# Patient Record
Sex: Female | Born: 1970 | Hispanic: Yes | Marital: Married | Smoking: Never smoker
Health system: Southern US, Community
[De-identification: ages and names within clinical notes are randomized; demographics above are authoritative.]

## PROBLEM LIST (undated history)

## (undated) DIAGNOSIS — J45909 Unspecified asthma, uncomplicated: Secondary | ICD-10-CM

## (undated) DIAGNOSIS — R7303 Prediabetes: Secondary | ICD-10-CM

---

## 2016-01-20 ENCOUNTER — Encounter: Payer: Self-pay | Admitting: Emergency Medicine

## 2016-01-20 ENCOUNTER — Emergency Department: Payer: Self-pay

## 2016-01-20 ENCOUNTER — Emergency Department
Admission: EM | Admit: 2016-01-20 | Discharge: 2016-01-20 | Disposition: A | Discharge status: Home / Self Care | Payer: Self-pay | Attending: Emergency Medicine | Admitting: Emergency Medicine

## 2016-01-20 DIAGNOSIS — R1030 Lower abdominal pain, unspecified: Secondary | ICD-10-CM | POA: Insufficient documentation

## 2016-01-20 DIAGNOSIS — M25551 Pain in right hip: Secondary | ICD-10-CM | POA: Insufficient documentation

## 2016-01-20 DIAGNOSIS — Z791 Long term (current) use of non-steroidal anti-inflammatories (NSAID): Secondary | ICD-10-CM | POA: Insufficient documentation

## 2016-01-20 DIAGNOSIS — M25552 Pain in left hip: Secondary | ICD-10-CM | POA: Insufficient documentation

## 2016-01-20 DIAGNOSIS — N939 Abnormal uterine and vaginal bleeding, unspecified: Secondary | ICD-10-CM | POA: Insufficient documentation

## 2016-01-20 LAB — COMPREHENSIVE METABOLIC PANEL
ALBUMIN: 3.9 g/dL (ref 3.5–5.0)
ALT: 44 U/L (ref 14–54)
ANION GAP: 6 (ref 5–15)
AST: 31 U/L (ref 15–41)
Alkaline Phosphatase: 72 U/L (ref 38–126)
BILIRUBIN TOTAL: 0.6 mg/dL (ref 0.3–1.2)
BUN: 12 mg/dL (ref 6–20)
CO2: 27 mmol/L (ref 22–32)
Calcium: 9 mg/dL (ref 8.9–10.3)
Chloride: 105 mmol/L (ref 101–111)
Creatinine, Ser: 1.01 mg/dL — ABNORMAL HIGH (ref 0.44–1.00)
GFR calc Af Amer: >60 mL/min (ref >60)
GFR calc non Af Amer: >60 mL/min (ref >60)
GLUCOSE: 154 mg/dL — AB (ref 65–99)
POTASSIUM: 3.3 mmol/L — AB (ref 3.5–5.1)
SODIUM: 138 mmol/L (ref 135–145)
TOTAL PROTEIN: 7.5 g/dL (ref 6.5–8.1)

## 2016-01-20 LAB — CHLAMYDIA/NGC RT PCR (ARMC ONLY)
CHLAMYDIA TR: NOT DETECTED
N gonorrhoeae: NOT DETECTED

## 2016-01-20 LAB — WET PREP, GENITAL
CLUE CELLS WET PREP: NONE SEEN
SPERM: NONE SEEN
TRICH WET PREP: NONE SEEN
WBC WET PREP: NONE SEEN
Yeast Wet Prep HPF POC: NONE SEEN

## 2016-01-20 LAB — CBC
HEMATOCRIT: 39 % (ref 35.0–47.0)
HEMOGLOBIN: 13.6 g/dL (ref 12.0–16.0)
MCH: 30.4 pg (ref 26.0–34.0)
MCHC: 34.9 g/dL (ref 32.0–36.0)
MCV: 87.1 fL (ref 80.0–100.0)
Platelets: 288 10*3/uL (ref 150–440)
RBC: 4.47 MIL/uL (ref 3.80–5.20)
RDW: 14.3 % (ref 11.5–14.5)
WBC: 8.3 10*3/uL (ref 3.6–11.0)

## 2016-01-20 LAB — HCG, QUANTITATIVE, PREGNANCY: hCG, Beta Chain, Quant, S: 2 m[IU]/mL (ref <5)

## 2016-01-20 MED ORDER — MEDROXYPROGESTERONE ACETATE 10 MG PO TABS
10.0000 mg | ORAL_TABLET | Freq: Every day | ORAL | Status: DC
  Administered 2016-01-20: 10 mg via ORAL
  Filled 2016-01-20: qty 1

## 2016-01-20 MED ORDER — MEDROXYPROGESTERONE ACETATE 10 MG PO TABS: 10.0000 mg | ORAL_TABLET | Freq: Every day | ORAL | 0 refills | Status: AC

## 2016-01-20 MED ORDER — IBUPROFEN 800 MG PO TABS: 800.0000 mg | ORAL_TABLET | Freq: Three times a day (TID) | ORAL | 0 refills | Status: AC | PRN

## 2016-01-20 NOTE — ED Provider Notes (Signed)
[facility] Emergency Department Provider Note   ____________________________________________   First MD Initiated Contact with Patient 01/20/16 [phone]     (approximate)  I have reviewed the triage vital signs and the nursing notes.   HISTORY  Chief Complaint Abdominal Pain    HPI Kaitlyn Watts is a 45 y.o. female who comes into the hospital today with abdominal pain and vaginal bleeding. The patient reports thatthis pain started yesterday. The patient has a history of a tubal ligation and typically has her period every month but has not had a period in 4 months. The patient is feeling pressure in her vagina. She reports that her last bowel movement was this morning. It was normal but she did have to push to get it out. The patient has had some nausea and vomiting with no dizziness but did feel like she was given a pass out. The patient reports that she has pain in her back and her hips. The patient reaches her pain a 10 out of 10 in intensity. The patient reports that initially she took some ibuprofen for the pain and it was better but then it the patient feels as though she has some vaginal pressure as well. She is here for evaluation.   History reviewed. No pertinent past medical history.  There are no active problems to display for this patient.   History reviewed. No pertinent surgical history.  Prior to Admission medications   Not on File    Allergies Review of patient's allergies indicates no known allergies.  No family history on file.  Social History Social History  Substance Use Topics  . Smoking status: Never Smoker  . Smokeless tobacco: Never Used  . Alcohol use No    Review of Systems Constitutional: No fever/chills Eyes: No visual changes. ENT: No sore throat. Cardiovascular: Denies chest pain. Respiratory: Denies shortness of breath. Gastrointestinal: No abdominal pain.  No nausea, no vomiting.  No diarrhea.  No  constipation. Genitourinary: Negative for dysuria. Musculoskeletal: Negative for back pain. Skin: Negative for rash. Neurological: Negative for headaches, focal weakness or numbness.  10-point ROS otherwise negative.  ____________________________________________   PHYSICAL EXAM:  VITAL SIGNS: ED Triage Vitals [01/20/16 0615]  Enc Vitals Group     BP                                   01/20/16 0650 109/66     Pulse                               01/20/16 0650 83     Resp                                01/20/16 0650 16     Temp      Temp src      SpO2                                01/20/16 0650 99%     Weight      Height      Head Circumference      Peak Flow      Pain Score  01/20/16 0650 10     Pain Loc      Pain Edu?      Excl. in [place]?     Constitutional: Alert and oriented. Well appearing and in Moderate distress. Eyes: Conjunctivae are normal. PERRL. EOMI. Head: Atraumatic. Nose: No congestion/rhinnorhea. Mouth/Throat: Mucous membranes are moist.  Oropharynx non-erythematous. Cardiovascular: Normal rate, regular rhythm. Grossly normal heart sounds.  Good peripheral circulation. Respiratory: Normal respiratory effort.  No retractions. Lungs CTAB. Gastrointestinal: Soft and nontender. No distention. Positive bowel sounds Genitourinary: Normal external genitalia, mild blood in the vaginal canal, narrow cervical opening with minimal cervical motion tenderness and an enlarged uterus. Musculoskeletal: No lower extremity tenderness nor edema.   Neurologic:  Normal speech and language.  Skin:  Skin is warm, dry and intact. No rash noted. Psychiatric: Mood and affect are normal.   ____________________________________________   LABS (all labs ordered are listed, but only abnormal results are displayed)  Labs Reviewed  COMPREHENSIVE METABOLIC PANEL - Abnormal; Notable for the following:       Result Value   Potassium 3.3 (*)    Glucose, Bld 154 (*)     Creatinine, Ser 1.01 (*)    All other components within normal limits  WET PREP, GENITAL  CBC  URINALYSIS COMPLETEWITH MICROSCOPIC ([facility] ONLY)  HCG, QUANTITATIVE, PREGNANCY  POC URINE PREG, ED   ____________________________________________  EKG  none ____________________________________________  RADIOLOGY  US pelvis ____________________________________________   PROCEDURES  Procedure(s) performed: None  Procedures  Critical Care performed: No  ____________________________________________   INITIAL IMPRESSION / ASSESSMENT AND PLAN / ED COURSE  Pertinent labs & imaging results that were available during my care of the patient were reviewed by me and considered in my medical decision making (see chart for details).  This is a 45 year old female who comes in for hospital today with lower abdominal pain and vaginal bleeding. The patient is having some pain she says since yesterday. The patient initially when I did a pelvic exam was not bleeding but then developed some profuse bleeding afterwards. I will attempt to get the patient's urinalysis and sent her for an ultrasound to determine what may be causing her bleeding. The patient's care will be signed out to Dr. Mayford KnifeWilliams who will follow-up the results of ultrasound and the rest of the patient's blood work.  Clinical Course     ____________________________________________   FINAL CLINICAL IMPRESSION(S) / ED DIAGNOSES  Final diagnoses:  Lower abdominal pain  Vaginal bleeding      NEW MEDICATIONS STARTED DURING THIS VISIT:  New Prescriptions   No medications on file     Note:  This document was prepared using Dragon voice recognition software and may include unintentional dictation errors.    Rebecka ApleyAllison P Webster, MD 01/20/16 [phone]

## 2016-01-20 NOTE — ED Triage Notes (Addendum)
Pt ambulatory to triage, appears uncomfortable, restless, holding lower back, crying; husb reports lower abd and back pain since yesterday accomp by vomiting; pt st no menstrual period for last 4 months, now bleeding with pain, like contractions

## 2016-01-20 NOTE — ED Notes (Signed)
Attempt twice to get in and out cath. Kaitlyn SparJulie Ed tech tried and myself only got blood no urine. Dr. Zenda AlpersWebster made aware.

## 2016-01-20 NOTE — ED Provider Notes (Signed)
Recent Results (from the past 2160 hour(s))  CBC     Status: None   Collection Time: 01/20/16  6:28 AM  Result Value Ref Range   WBC 8.3 3.6 - 11.0 K/uL   RBC 4.47 3.80 - 5.20 MIL/uL   Hemoglobin 13.6 12.0 - 16.0 g/dL   HCT 39.0 35.0 - 47.0 %   MCV 87.1 80.0 - 100.0 fL   MCH 30.4 26.0 - 34.0 pg   MCHC 34.9 32.0 - 36.0 g/dL   RDW 14.3 11.5 - 14.5 %   Platelets 288 150 - 440 K/uL  Comprehensive metabolic panel     Status: Abnormal   Collection Time: 01/20/16  6:28 AM  Result Value Ref Range   Sodium 138 135 - 145 mmol/L   Potassium 3.3 (L) 3.5 - 5.1 mmol/L   Chloride 105 101 - 111 mmol/L   CO2 27 22 - 32 mmol/L   Glucose, Bld 154 (H) 65 - 99 mg/dL   BUN 12 6 - 20 mg/dL   Creatinine, Ser 1.01 (H) 0.44 - 1.00 mg/dL   Calcium 9.0 8.9 - 10.3 mg/dL   Total Protein 7.5 6.5 - 8.1 g/dL   Albumin 3.9 3.5 - 5.0 g/dL   AST 31 15 - 41 U/L   ALT 44 14 - 54 U/L   Alkaline Phosphatase 72 38 - 126 U/L   Total Bilirubin 0.6 0.3 - 1.2 mg/dL   GFR calc non Af Amer >60 >60 mL/min   GFR calc Af Amer >60 >60 mL/min    Comment: (NOTE) The eGFR has been calculated using the CKD EPI equation. This calculation has not been validated in all clinical situations. eGFR's persistently <60 mL/min signify possible Chronic Kidney Disease.    Anion gap 6 5 - 15  hCG, quantitative, pregnancy     Status: None   Collection Time: 01/20/16  6:28 AM  Result Value Ref Range   hCG, Beta Chain, Quant, S 2 <5 mIU/mL    Comment:          GEST. AGE      CONC.  (mIU/mL)   <=1 WEEK        5 - 50     2 WEEKS       50 - 500     3 WEEKS       100 - 10,000     4 WEEKS     1,000 - 30,000     5 WEEKS     3,500 - 115,000   6-8 WEEKS     12,000 - 270,000    12 WEEKS     15,000 - 220,000        FEMALE AND NON-PREGNANT FEMALE:     LESS THAN 5 mIU/mL   Wet prep, genital     Status: None   Collection Time: 01/20/16  6:31 AM  Result Value Ref Range   Yeast Wet Prep HPF POC NONE SEEN NONE SEEN   Trich, Wet Prep NONE SEEN  NONE SEEN   Clue Cells Wet Prep HPF POC NONE SEEN NONE SEEN   WBC, Wet Prep HPF POC NONE SEEN NONE SEEN   Sperm NONE SEEN   Chlamydia/NGC rt PCR ([facility] only)     Status: None   Collection Time: 01/20/16  6:31 AM  Result Value Ref Range   Specimen source GC/Chlam ENDOCERVICAL    Chlamydia Tr NOT DETECTED NOT DETECTED   N gonorrhoeae NOT DETECTED NOT DETECTED    Comment: (NOTE)  100  This methodology has not been evaluated in pregnant women or in 200  patients with a history of hysterectomy. 300 400  This methodology will not be performed on patients less than 54  years of age.    IMPRESSION: Abnormally thickened endometrium in a premenopausal patient. If bleeding remains unresponsive to hormonal or medical therapy, focal lesion work-up with sonohysterogram should be considered. Endometrial biopsy should also be considered in pre-menopausal patients at high risk for endometrial carcinoma. (Ref: Radiological Reasoning: Algorithmic Workup of Abnormal Vaginal Bleeding with Endovaginal Sonography and Sonohysterography. AJR 2008; 010:U72-53)  Patient with ultrasound findings as dictated above. She will be started on hormonal therapy and referred to OB/GYN for close outpatient follow-up.     Earleen Newport, MD 01/20/16 [phone]

## 2016-01-20 NOTE — ED Notes (Signed)
Report given to Jane RN

## 2016-08-18 IMAGING — US US TRANSVAGINAL NON-OB
1 series · 13 of 25 positions shown · non-contrast
Comparison: None

CLINICAL DATA: Pelvic pain and bleeding beginning last night.

EXAM:
TRANSABDOMINAL AND TRANSVAGINAL ULTRASOUND OF PELVIS
TECHNIQUE: Both transabdominal and transvaginal ultrasound examinations of the
pelvis were performed. Transabdominal technique was performed for
global imaging of the pelvis including uterus, ovaries, adnexal
regions, and pelvic cul-de-sac. It was necessary to proceed with
endovaginal exam following the transabdominal exam to visualize the
right ovary.

[Series 1: us transvaginal non-ob · 0.27mm/px · 13 of 84 slices shown]
[im 1/84]
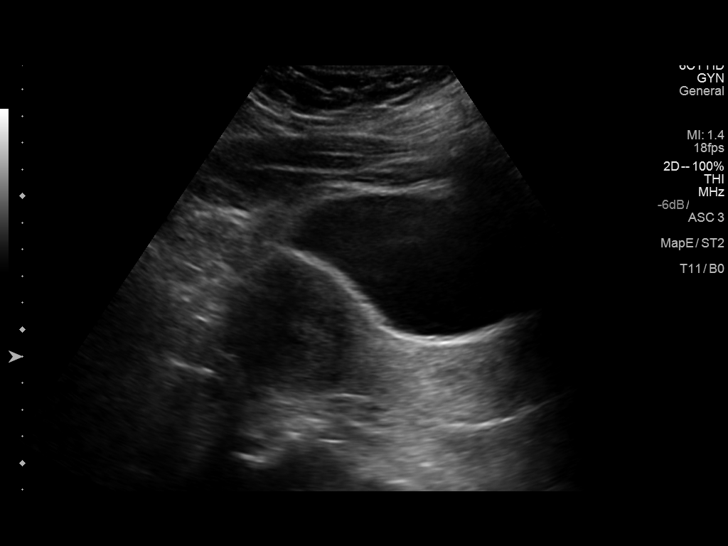
[im 7/84]
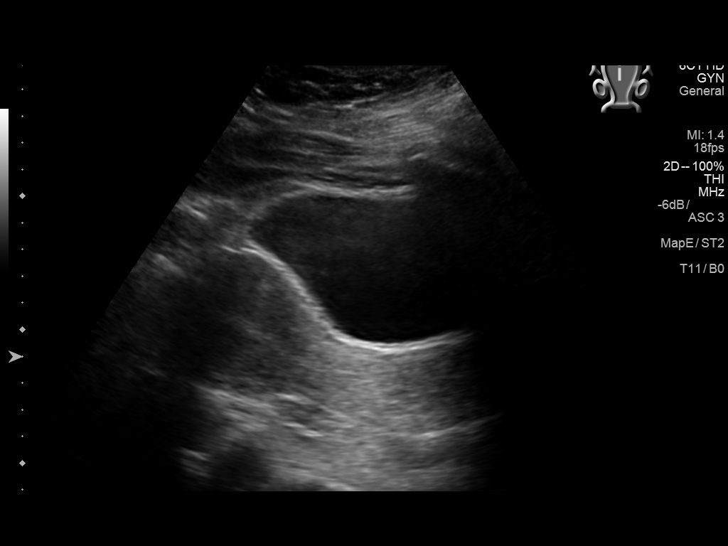
[im 14/84]
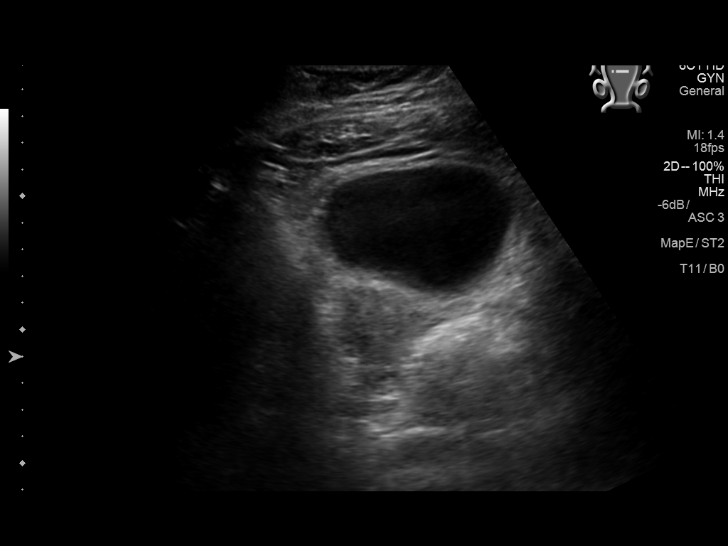
[im 21/84]
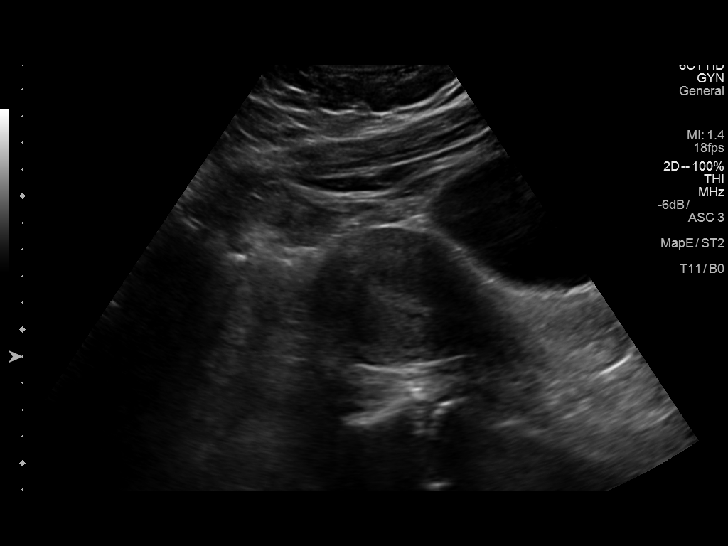
[im 28/84]
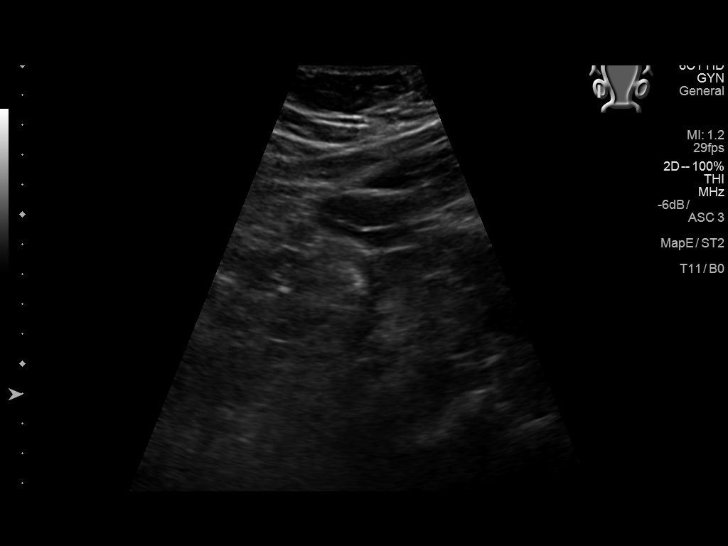
[im 35/84]
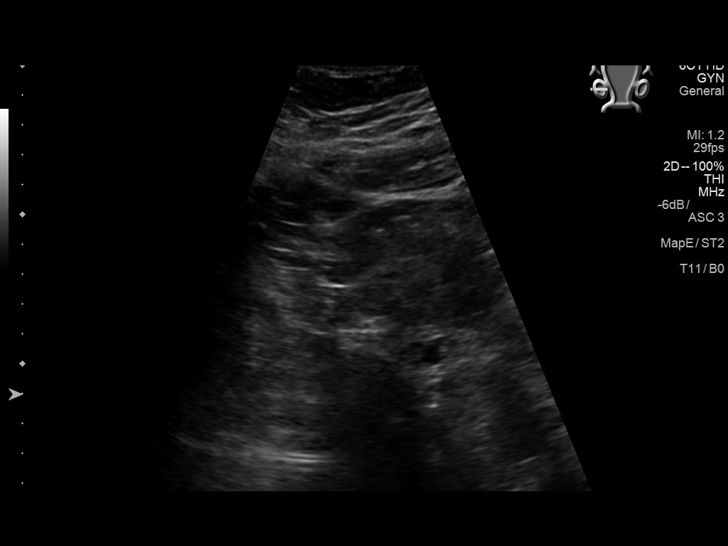
[im 42/84]
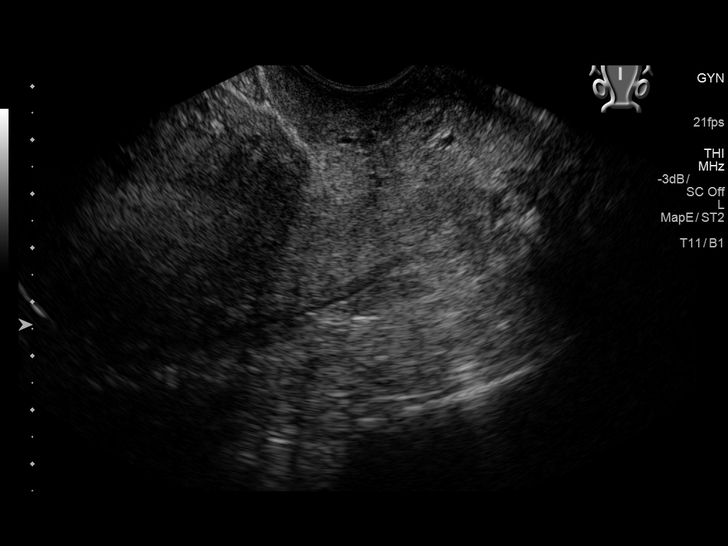
[im 49/84]
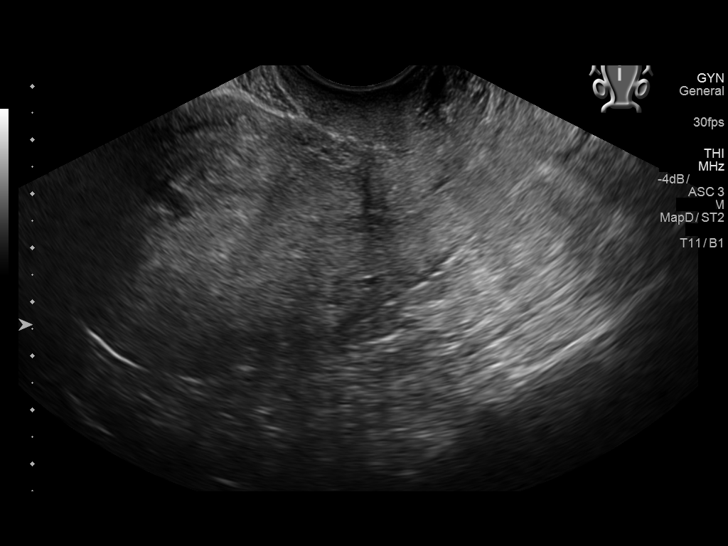
[im 56/84]
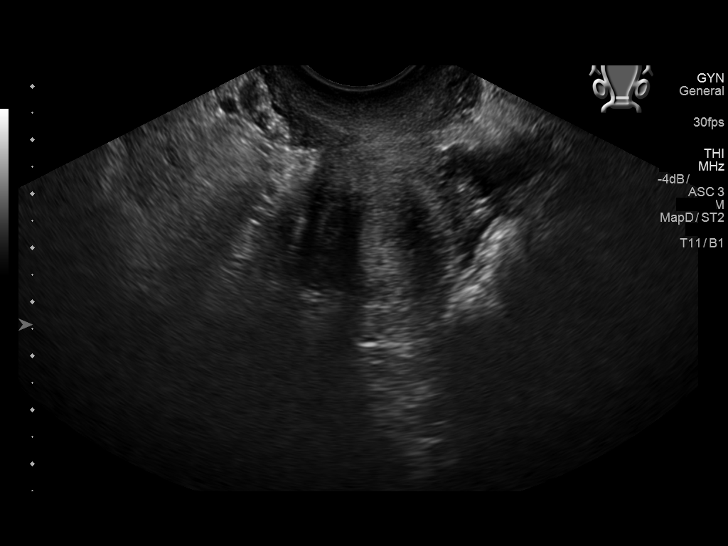
[im 63/84]
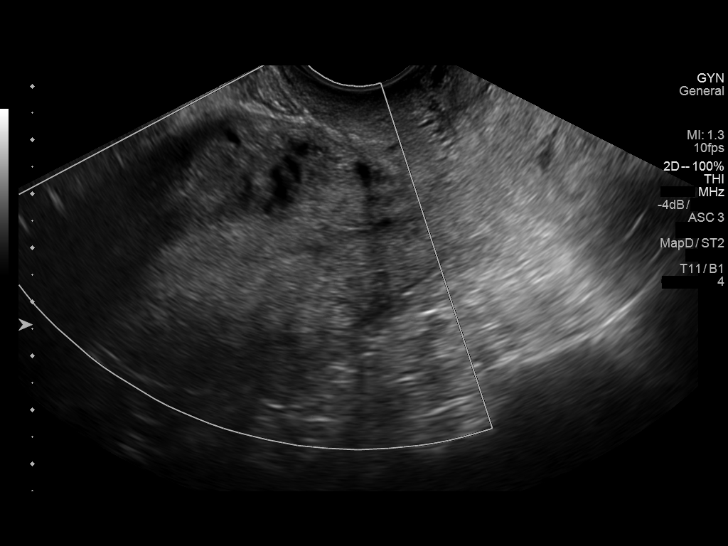
[im 70/84]
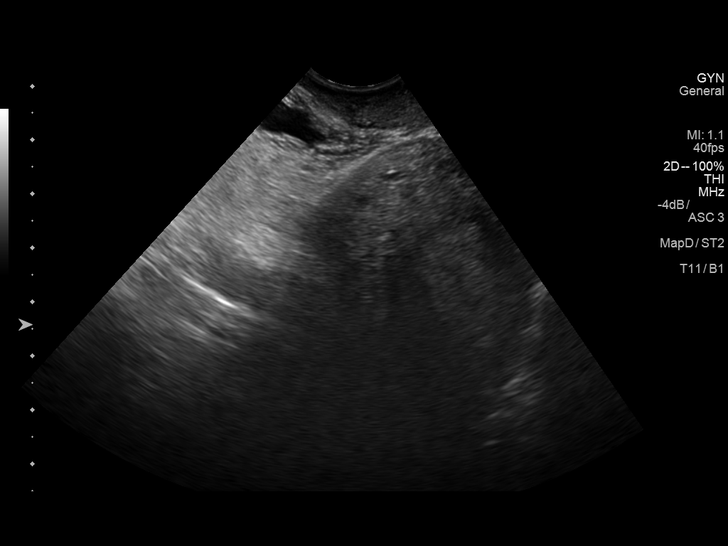
[im 77/84]
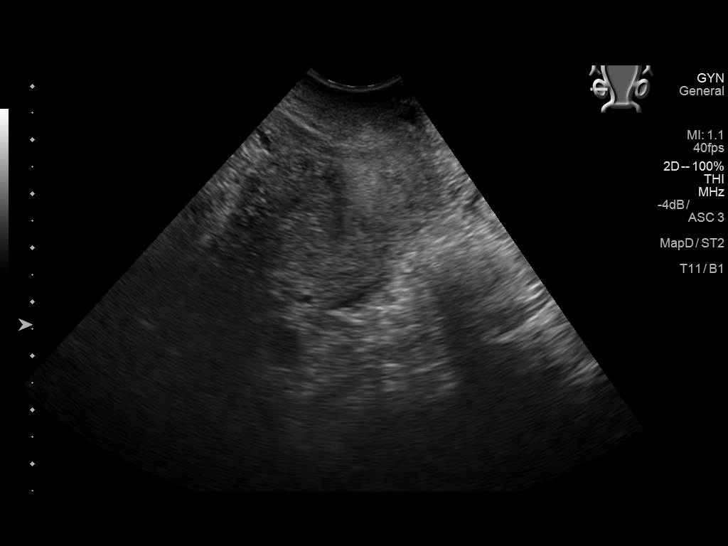
[im 84/84]
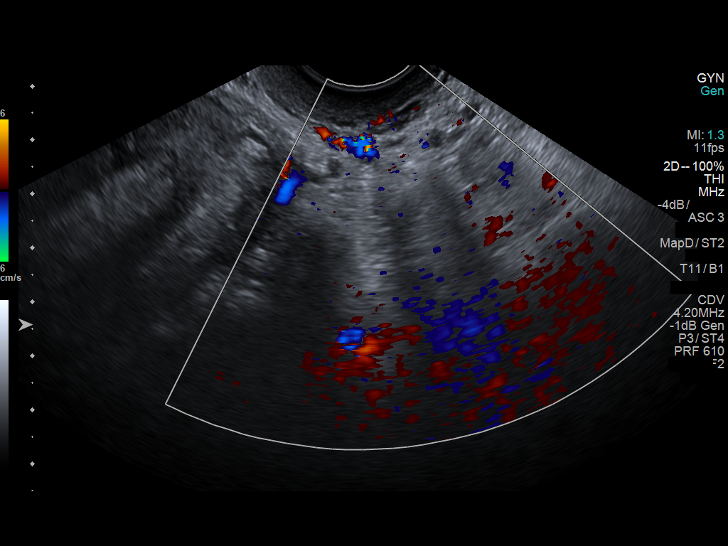

[13 of 25 positions shown; findings below may reference images not displayed]

FINDINGS: Uterus

Measurements: 12.3 x 4.9 x 5.9 cm. No fibroids or other mass
visualized.

Endometrium

Thickness: 1.7 cm.  No focal abnormality visualized.

Right ovary

Measurements: 1.1 x 1.4 x 1.3 cm. Normal appearance/no adnexal mass.

Left ovary

Not visualized.

Other findings

No abnormal free fluid.
IMPRESSION: Abnormally thickened endometrium in a premenopausal patient. If
bleeding remains unresponsive to hormonal or medical therapy, focal
lesion work-up with sonohysterogram should be considered.
Endometrial biopsy should also be considered in pre-menopausal
patients at high risk for endometrial carcinoma. (Ref: Radiological
Reasoning: Algorithmic Workup of Abnormal Vaginal Bleeding with
Endovaginal Sonography and Sonohysterography. AJR 6447; 191:S68-73)

## 2019-08-31 ENCOUNTER — Ambulatory Visit: Payer: Self-pay | Attending: Internal Medicine

## 2019-08-31 DIAGNOSIS — Z23 Encounter for immunization: Secondary | ICD-10-CM

## 2019-08-31 NOTE — Progress Notes (Signed)
   Covid-19 Vaccination Clinic  Name:  Kaitlyn Watts    MRN: [id number] DOB: 09/29/1970  08/31/2019  Ms. Kaitlyn Watts was observed post Covid-19 immunization for 15 minutes without incident. She was provided with Vaccine Information Sheet and instruction to access the V-Safe system.   Ms. Kaitlyn Watts was instructed to call 911 with any severe reactions post vaccine: Marland Kitchen Difficulty breathing  . Swelling of face and throat  . A fast heartbeat  . A bad rash all over body  . Dizziness and weakness   Immunizations Administered    Name Date Dose VIS Date Route   Pfizer COVID-19 Vaccine 08/31/2019  4:34 PM 0.3 mL 05/24/2019 Intramuscular   Manufacturer: [employer], [employer]   Lot: [id number]   NDC: [address]-3

## 2019-09-21 ENCOUNTER — Ambulatory Visit: Payer: Self-pay | Attending: Internal Medicine

## 2019-09-21 DIAGNOSIS — Z23 Encounter for immunization: Secondary | ICD-10-CM

## 2019-09-21 NOTE — Progress Notes (Signed)
   Covid-19 Vaccination Clinic  Name:  [name]    MRN: [id number] DOB: Nov 03, 1970  09/21/2019  Ms. Kaitlyn Watts was observed post Covid-19 immunization for 15 minutes without incident. She was provided with Vaccine Information Sheet and instruction to access the V-Safe system.   Ms. Kaitlyn Watts was instructed to call 911 with any severe reactions post vaccine: Marland Kitchen Difficulty breathing  . Swelling of face and throat  . A fast heartbeat  . A bad rash all over body  . Dizziness and weakness   Immunizations Administered    Name Date Dose VIS Date Route   Pfizer COVID-19 Vaccine 09/21/2019  4:09 PM 0.3 mL 05/24/2019 Intramuscular   Manufacturer: [employer], [employer]   Lot: [id number]   NDC: [address]-9

## 2020-10-19 ENCOUNTER — Encounter: Payer: Self-pay | Admitting: Family Medicine

## 2020-11-10 ENCOUNTER — Ambulatory Visit: Payer: Self-pay | Attending: Oncology

## 2020-11-10 ENCOUNTER — Ambulatory Visit
Admission: RE | Admit: 2020-11-10 | Discharge: 2020-11-10 | Disposition: A | Discharge status: Home / Self Care | Payer: Self-pay | Source: Ambulatory Visit | Attending: Oncology | Admitting: Oncology

## 2020-11-10 ENCOUNTER — Other Ambulatory Visit: Payer: Self-pay

## 2020-11-10 VITALS — BP 137/86 | Temp 97.0°F | Ht 60.6 in | Wt 160.0 lb

## 2020-11-10 DIAGNOSIS — Z0000 Encounter for general adult medical examination without abnormal findings: Secondary | ICD-10-CM

## 2020-11-10 NOTE — Progress Notes (Signed)
  Subjective:     Patient ID: Kaitlyn Watts, female   DOB: 10-22-1970, 50 y.o.   MRN: [id number]  HPI   Review of Systems     Objective:   Physical Exam Chest:  Breasts:     Right: No swelling, bleeding, inverted nipple, mass, nipple discharge, skin change or tenderness.     Left: No swelling, bleeding, inverted nipple, mass, nipple discharge, skin change or tenderness.    Genitourinary:    Labia:        Right: No rash, tenderness, lesion or injury.        Left: No rash, tenderness, lesion or injury.      Cervix: No cervical motion tenderness, discharge, friability, lesion, erythema, cervical bleeding or eversion.     Uterus: Not deviated, not enlarged, not fixed, not tender and no uterine prolapse.      Adnexa:        Right: No mass, tenderness or fullness.         Left: No mass, tenderness or fullness.          Assessment:     50 year old Hispanic patient presents for BCCCP screening. Patient screened, and meets BCCCP eligibility.  Patient does not have insurance, Medicare or Medicaid.   Instructed patient on breast self awareness using teach back method.  Clinical breast exam unremarkable.  No mass or lump palpated.  Pelvic exam normal.  Patient has bump on left forearm.  States she has had I t x 3 years , and saw dermatologist.  Still hurts.  Encouraged to follow up with primary provider. Risk Assessment    Risk Scores      11/10/2020   Last edited by: Jim Like, RN   5-year risk: 0.5 %   Lifetime risk: 4 %         Plan:     Sent for bilateral baseline screening mammogram.  Specimen collected for pap.

## 2020-11-11 ENCOUNTER — Other Ambulatory Visit: Payer: Self-pay | Admitting: *Deleted

## 2020-11-11 DIAGNOSIS — N6489 Other specified disorders of breast: Secondary | ICD-10-CM

## 2020-11-14 LAB — IGP, APTIMA HPV: HPV Aptima: NEGATIVE

## 2020-11-18 ENCOUNTER — Ambulatory Visit
Admission: RE | Admit: 2020-11-18 | Discharge: 2020-11-18 | Disposition: A | Discharge status: Home / Self Care | Payer: Self-pay | Source: Ambulatory Visit | Attending: Oncology | Admitting: Oncology

## 2020-11-18 ENCOUNTER — Other Ambulatory Visit: Payer: Self-pay

## 2020-11-18 DIAGNOSIS — N6489 Other specified disorders of breast: Secondary | ICD-10-CM

## 2020-11-26 ENCOUNTER — Other Ambulatory Visit: Payer: Self-pay

## 2020-11-26 DIAGNOSIS — N63 Unspecified lump in unspecified breast: Secondary | ICD-10-CM

## 2020-11-26 NOTE — Progress Notes (Signed)
Patient scheduled to return for 6 month follow-up mammogram 05/20/21.  Notified of negative/negative pap results. Next pap due in 5 years.

## 2020-11-26 NOTE — Progress Notes (Signed)
Radiologist reviewed Birads 3 results with patient.  6 month follow-up mammogram to be scheduled.  Letter mailed to notify of normal pap results. Next pap due in 5 years.

## 2021-05-20 ENCOUNTER — Ambulatory Visit
Admission: RE | Admit: 2021-05-20 | Discharge: 2021-05-20 | Disposition: A | Discharge status: Home / Self Care | Payer: Self-pay | Source: Ambulatory Visit | Attending: Oncology | Admitting: Oncology

## 2021-05-20 ENCOUNTER — Other Ambulatory Visit: Payer: Self-pay

## 2021-05-20 DIAGNOSIS — N63 Unspecified lump in unspecified breast: Secondary | ICD-10-CM

## 2022-06-01 IMAGING — MG MM DIGITAL DIAGNOSTIC UNILAT*L* W/ TOMO W/ CAD
6 of 10 series · 6 of 30 positions shown · non-contrast
Comparison: Prior films

CLINICAL DATA: Callback from screening mammogram for possible
asymmetry left breast

EXAM:
DIGITAL DIAGNOSTIC UNILATERAL LEFT MAMMOGRAM WITH TOMOSYNTHESIS AND
CAD
TECHNIQUE: Left digital diagnostic mammography and breast tomosynthesis was
performed. The images were evaluated with computer-aided detection.

[L CC synth-2D (1 of 4)]
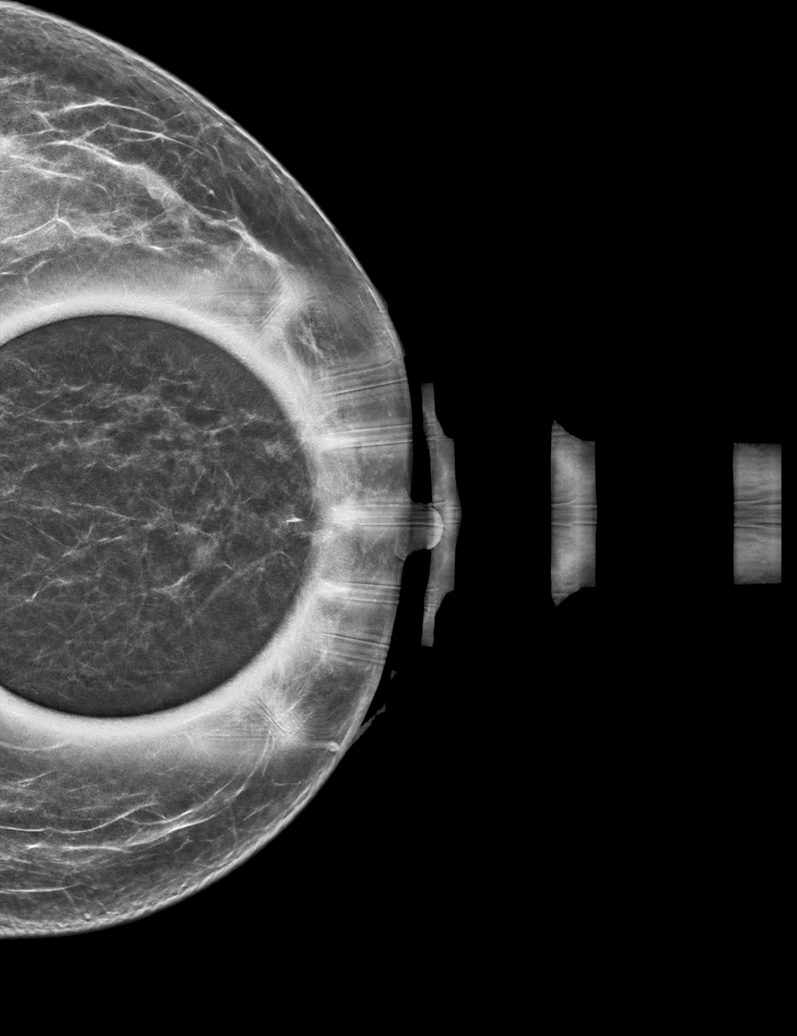

[L ML synth-2D]
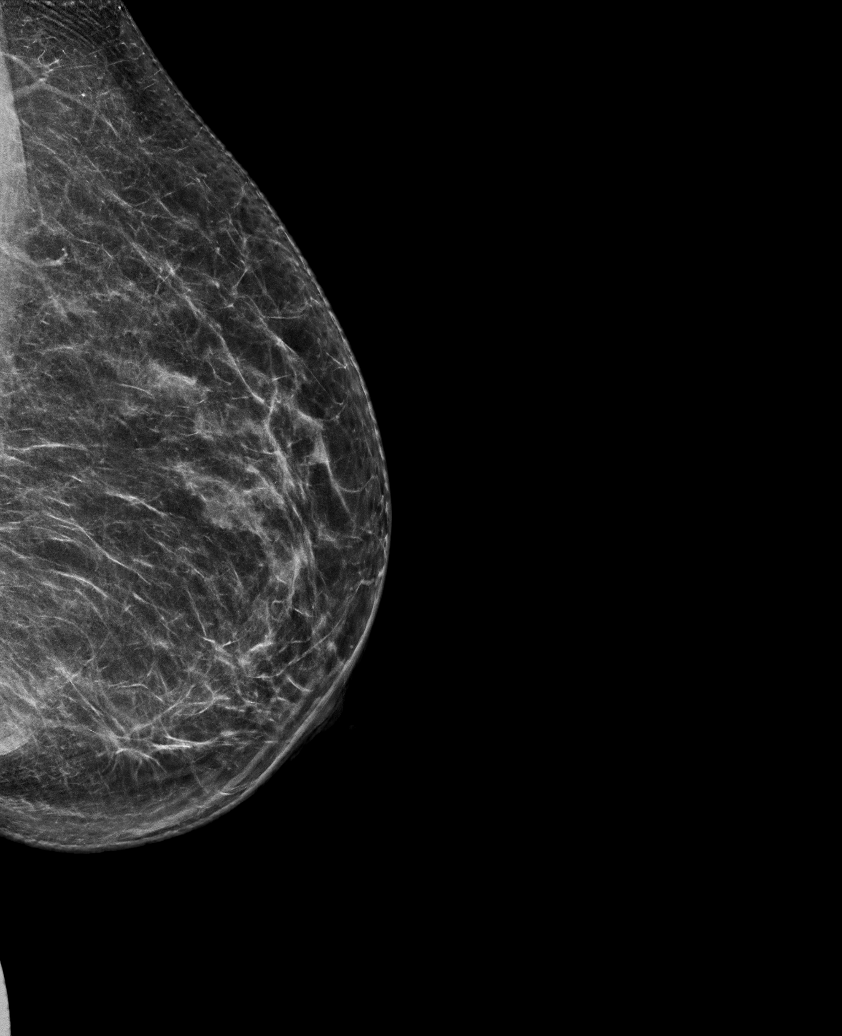

[L CC synth-2D (2 of 4)]
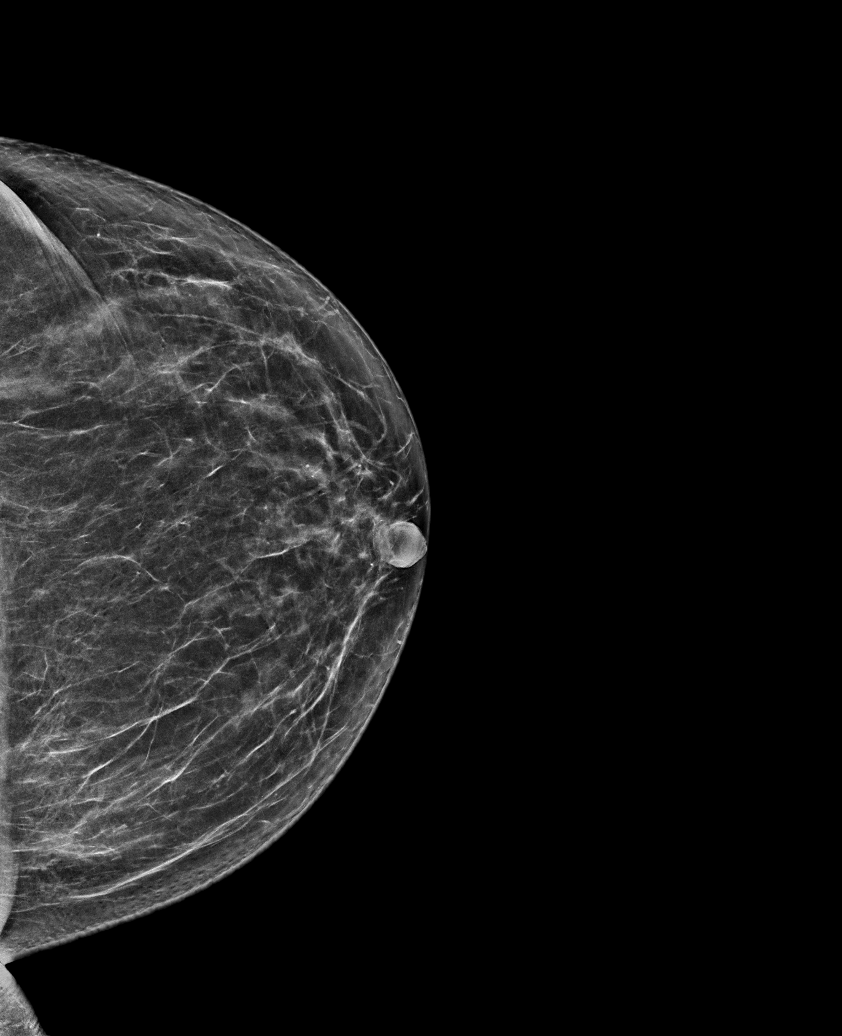

[L CC synth-2D (3 of 4)]
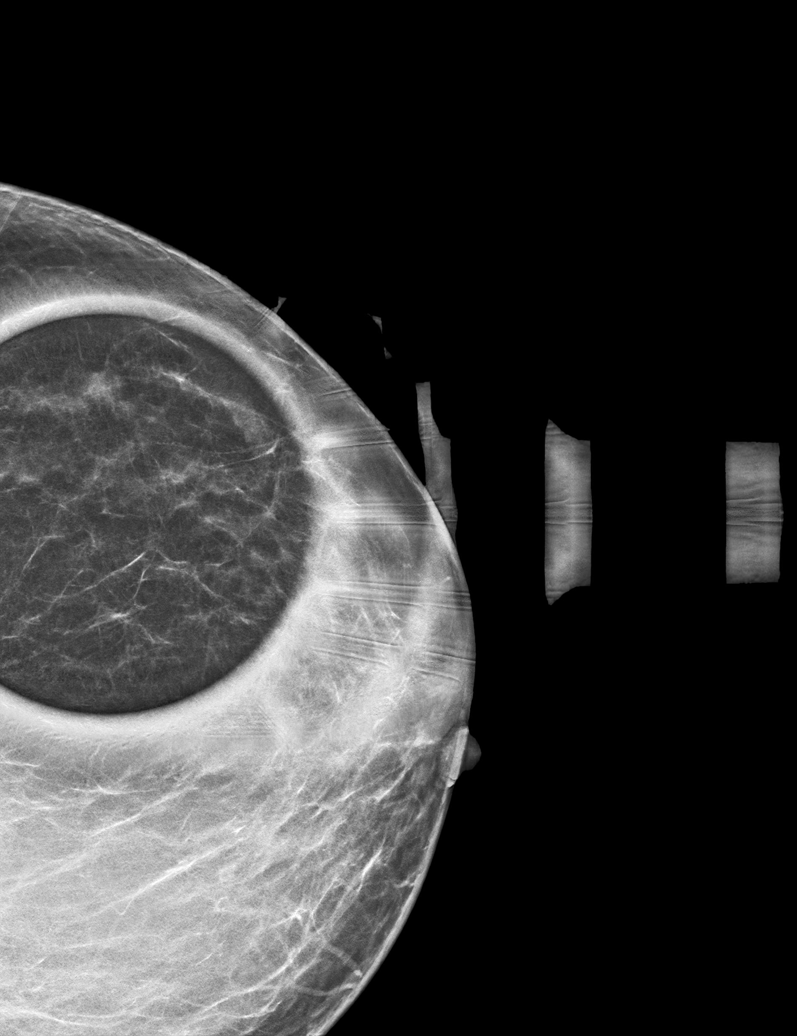

[L CC synth-2D (4 of 4)]
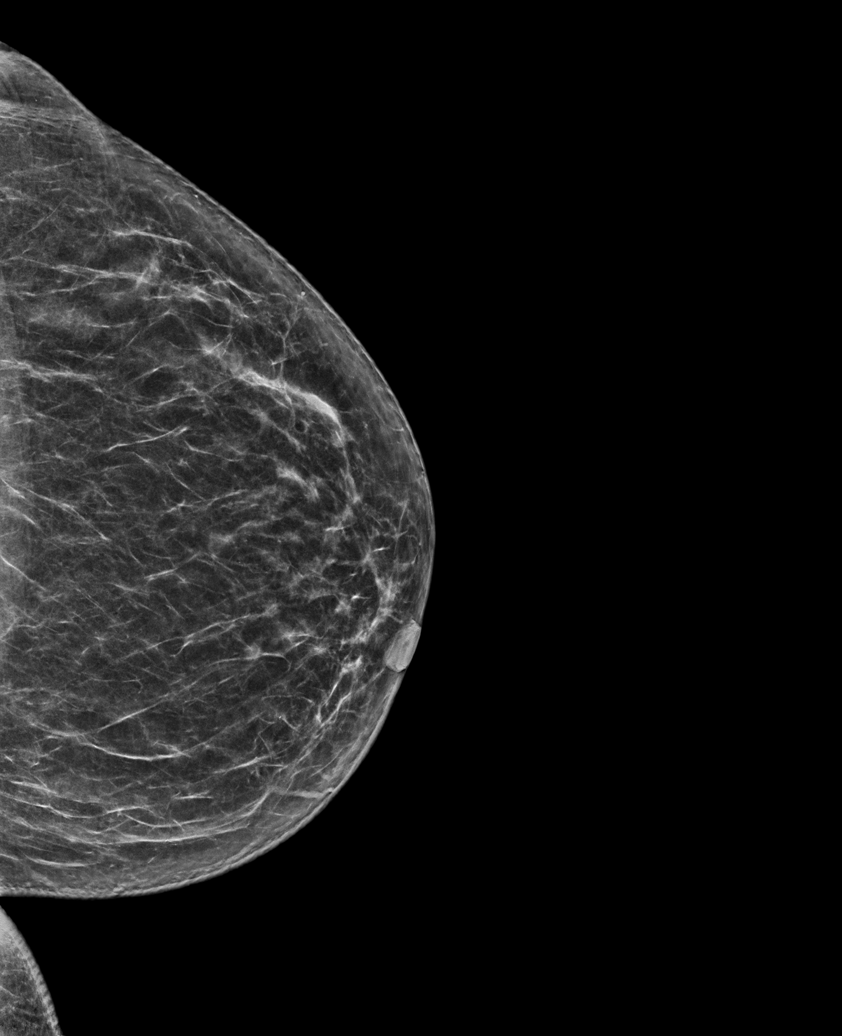

[L CC tomo · tomo slice 35/69.0]
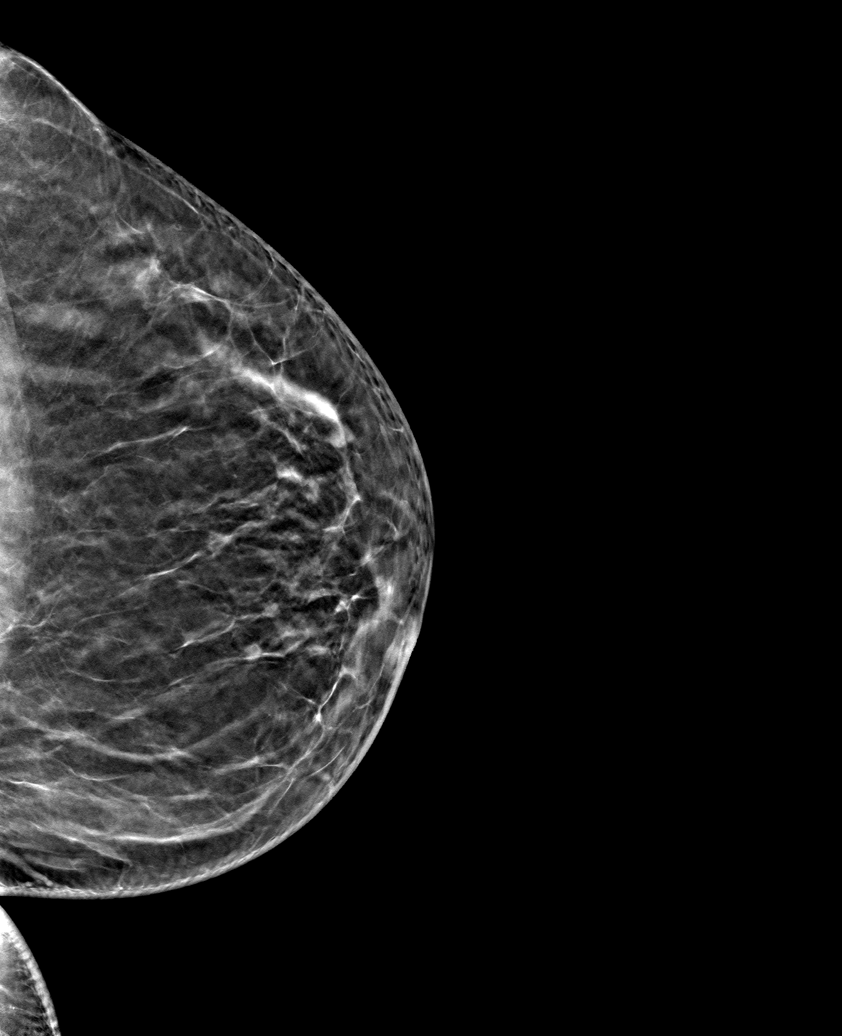

[6 of 30 positions shown; findings below may reference images not displayed]

ACR Breast Density Category b: There are scattered areas of
fibroglandular density.
FINDINGS: Lateral view of left breast, spot compression left cc view, medial
and lateral rolled left cc views are submitted. The previously
questioned asymmetry is less prominent on additional views.
IMPRESSION: Probable benign findings.

RECOMMENDATION:
Six-month follow-up mammogram left breast.

I have discussed the findings and recommendations with the patient.
If applicable, a reminder letter will be sent to the patient
regarding the next appointment.

BI-RADS CATEGORY  3: Probably benign.

## 2022-08-08 ENCOUNTER — Other Ambulatory Visit: Payer: Self-pay

## 2022-08-08 ENCOUNTER — Emergency Department: Payer: Self-pay

## 2022-08-08 ENCOUNTER — Emergency Department
Admission: EM | Admit: 2022-08-08 | Discharge: 2022-08-08 | Disposition: A | Discharge status: Home / Self Care | Payer: Self-pay | Attending: Emergency Medicine | Admitting: Emergency Medicine

## 2022-08-08 DIAGNOSIS — N3000 Acute cystitis without hematuria: Secondary | ICD-10-CM | POA: Insufficient documentation

## 2022-08-08 HISTORY — DX: Unspecified asthma, uncomplicated: J45.909

## 2022-08-08 HISTORY — DX: Prediabetes: R73.03

## 2022-08-08 LAB — URINALYSIS, ROUTINE W REFLEX MICROSCOPIC
Bacteria, UA: NONE SEEN
Bilirubin Urine: NEGATIVE
Glucose, UA: NEGATIVE mg/dL
Ketones, ur: NEGATIVE mg/dL
Nitrite: NEGATIVE
Protein, ur: NEGATIVE mg/dL
Specific Gravity, Urine: 1.01 (ref 1.005–1.030)
pH: 5 (ref 5.0–8.0)

## 2022-08-08 MED ORDER — CEPHALEXIN 500 MG PO CAPS
500.0000 mg | ORAL_CAPSULE | Freq: Once | ORAL | Status: AC
  Administered 2022-08-08: 500 mg via ORAL
  Filled 2022-08-08: qty 1

## 2022-08-08 MED ORDER — CEFADROXIL 500 MG PO CAPS: 1000.0000 mg | ORAL_CAPSULE | Freq: Two times a day (BID) | ORAL | 0 refills | Status: AC

## 2022-08-08 MED ORDER — CEFADROXIL 500 MG PO CAPS: 500.0000 mg | ORAL_CAPSULE | Freq: Two times a day (BID) | ORAL | 0 refills | Status: DC

## 2022-08-08 NOTE — ED Provider Notes (Signed)
[facility] Provider Note    Event Date/Time   First MD Initiated Contact with Patient 08/08/22 [phone]     (approximate)   History   Flank Pain  The patient and/or family speak(s) Spanish.  They understand they have the right to the use of a hospital interpreter, however at this time they prefer to speak directly with me in [place].  They know that they can ask for an interpreter at any time.   HPI  Kaitlyn Watts is a 52 y.o. female who presents for evaluation of several days of pain in her lower abdomen radiating around to her right side.  She has had no recent trauma.  She has some burning when she urinates.  She does not typically have the symptoms; this is a new thing for her.  No recent trauma or injury.  She said that when the pain is hurting her she feels hot but otherwise she has had no fever.  No chest pain, shortness of breath or vomiting.  Some nausea.     Physical Exam   Triage Vital Signs: ED Triage Vitals  Enc Vitals Group     BP 08/08/22 0159 135/79     Pulse Rate 08/08/22 0159 88     Resp 08/08/22 0159 19     Temp 08/08/22 0159 98 F (36.7 C)     Temp Source 08/08/22 0159 Oral     SpO2 08/08/22 0159 98 %     Weight 08/08/22 0200 73.1 kg (161 lb 2.5 oz)     Height 08/08/22 0200 1.524 m (5')     Head Circumference --      Peak Flow --      Pain Score 08/08/22 0159 8     Pain Loc --      Pain Edu? --      Excl. in [place]? --     Most recent vital signs: Vitals:   08/08/22 0500 08/08/22 0530  BP: 128/71 (!) 142/83  Pulse: 70 76  Resp:  18  Temp:    SpO2: 93% 98%     General: Awake, no distress.  CV:  Good peripheral perfusion.  Resp:  Normal effort. Speaking easily and comfortably, no accessory muscle usage nor intercostal retractions.   Abd:  No distention.  No tenderness to palpation of the abdomen.   ED Results / Procedures / Treatments   Labs (all labs ordered are listed, but only abnormal results are  displayed) Labs Reviewed  URINALYSIS, ROUTINE W REFLEX MICROSCOPIC - Abnormal; Notable for the following components:      Result Value   Color, Urine AMBER (*)    APPearance CLEAR (*)    Hgb urine dipstick SMALL (*)    Leukocytes,Ua TRACE (*)    All other components within normal limits  URINE CULTURE    RADIOLOGY I viewed and interpreted the patient's CT abdomen/pelvis renal protocol.  I see no evidence of ureteral or renal stone.  The radiologist commented on what appears to be acute cystitis but relatively mild.    PROCEDURES:  Critical Care performed: No  Procedures   MEDICATIONS ORDERED IN ED: Medications  cephALEXin (KEFLEX) capsule 500 mg (500 mg Oral Given 08/08/22 [id number])     IMPRESSION / MDM / ASSESSMENT AND PLAN / ED COURSE  I reviewed the triage vital signs and the nursing notes.  Differential diagnosis includes, but is not limited to, UTI, pyelonephritis, renal/ureteral colic, musculoskeletal strain.  Patient's presentation is most consistent with acute presentation with potential threat to life or bodily function.  Labs/studies ordered: Urinalysis, urine culture, CT renal stone study Interventions/Medications given: Keflex 500 mg p.o. [facility] Course my include additional interventions not listed in this section:)  Vital signs are stable and reassuring.  Physical exam is also generally reassuring.  She has only trace leukocytes but she has dysuria and radiographic evidence of cystitis on CT scan.  Given that she is having some pain up into her right side, I will treat empirically for complicated UTI with cefadroxil 1000 mg p.o. twice daily x 10 days rather than the lower dose 500 mg p.o. twice daily.  She can follow-up with her primary care doctor at the next fillable opportunity.  No indication for blood work at this time.  I gave my usual customary follow-up recommendations and return precautions.         FINAL CLINICAL  IMPRESSION(S) / ED DIAGNOSES   Final diagnoses:  Acute cystitis without hematuria     Rx / DC Orders   ED Discharge Orders          Ordered    cefadroxil (DURICEF) 500 MG capsule  2 times daily,   Status:  Discontinued        08/08/22 0608    cefadroxil (DURICEF) 500 MG capsule  2 times daily        08/08/22 [id number]             Note:  This document was prepared using Dragon voice recognition software and may include unintentional dictation errors.   Hinda Kehr, MD 08/08/22 [phone]

## 2022-08-08 NOTE — ED Notes (Signed)
ED Provider at bedside. 

## 2022-08-08 NOTE — ED Notes (Signed)
Pt brought to ed rm 7 at this time, this RN now assuming care.

## 2022-08-08 NOTE — ED Notes (Signed)
Lab called regarding patient's urine sample, per lab, not enough with first sample, pt given cup for recollect of urine and provided UA at this time.

## 2022-08-08 NOTE — ED Triage Notes (Addendum)
Pt arrives with CC of R flank pain for the last two days with associated nausea. Denies diarrhea. Denies burning or changes with urination. Denies known injury or trauma.

## 2022-08-08 NOTE — ED Notes (Signed)
Pt verbalized understanding of DC instructions with use of video interpreter. Signing pad did not work.

## 2022-08-09 LAB — URINE CULTURE: Culture: NO GROWTH
